# Patient Record
Sex: Male | Born: 1965 | Race: Black or African American | Hispanic: No | Marital: Married | Smoking: Never smoker
Health system: Southern US, Community
[De-identification: ages and names within clinical notes are randomized; demographics above are authoritative.]

## PROBLEM LIST (undated history)

## (undated) DIAGNOSIS — I10 Essential (primary) hypertension: Secondary | ICD-10-CM

## (undated) HISTORY — DX: Essential (primary) hypertension: I10

---
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
## 2006-10-07 ENCOUNTER — Ambulatory Visit (HOSPITAL_COMMUNITY): Admission: RE | Admit: 2006-10-07 | Discharge: 2006-10-07 | Payer: Self-pay | Admitting: Family Medicine

## 2010-03-25 ENCOUNTER — Encounter: Payer: Self-pay | Admitting: Family Medicine

## 2011-09-03 ENCOUNTER — Encounter: Payer: Self-pay | Admitting: Family Medicine

## 2011-09-03 ENCOUNTER — Ambulatory Visit (INDEPENDENT_AMBULATORY_CARE_PROVIDER_SITE_OTHER): Payer: 59 | Admitting: Family Medicine

## 2011-09-03 VITALS — BP 150/93 | HR 78 | Temp 97.8°F | Ht 69.0 in | Wt 190.0 lb

## 2011-09-03 DIAGNOSIS — M5412 Radiculopathy, cervical region: Secondary | ICD-10-CM

## 2011-09-03 MED ORDER — PREDNISONE 10 MG PO TABS: ORAL_TABLET | ORAL | Status: DC

## 2011-09-03 NOTE — Patient Instructions (Addendum)
You have cervical radiculopathy (an irritated nerve in the neck). Prednisone 6 day dose pack followed by naproxen twice a day with food as needed to relieve irritation/inflammation of the nerve. Consider cervical collar if severely painful. Simple range of motion exercises within limits of pain to prevent further stiffness. Speak to your brother about a home exercise program for cervical radiculopathy.  If you want to do formal PT, let me know. Heat 15 minutes at a time 3-4 times a day to help with spasms. Watch head position when on computers, texting, when sleeping in bed - should in line with back to prevent further nerve traction and irritation. Follow up with me in 6 weeks or as needed. If not improving we will consider further imaging.

## 2011-09-04 ENCOUNTER — Encounter: Payer: Self-pay | Admitting: Family Medicine

## 2011-09-04 DIAGNOSIS — M5412 Radiculopathy, cervical region: Secondary | ICD-10-CM | POA: Insufficient documentation

## 2011-09-04 NOTE — Progress Notes (Signed)
  Subjective:    Patient ID: Craig Thompson, male    DOB: [birth year], 46 y.o.   MRN: [id number]  PCP: Dr. Mila Palmer  HPI 46 yo M here for left shoulder, arm pain.  Patient reports for several years has had off and on left shoulder, arm pain. Then past month has become much more constant. Start superior lateral shoulder, radiates up to neck and down to base of thumb. Associated with tingling in this distribution. No weakness. + night pain. Tried naproxen without much benefit. Not worse or better with any arm/shoulder motions. Right handed.  Past Medical History  Diagnosis Date  . Hypertension     Current Outpatient Prescriptions on File Prior to Visit  Medication Sig Dispense Refill  . amLODipine (NORVASC) 10 MG tablet Take 10 mg by mouth daily.      Marland Kitchen lisinopril (PRINIVIL,ZESTRIL) 10 MG tablet Take 10 mg by mouth daily.        History reviewed. No pertinent past surgical history.  No Known Allergies  History   Social History  . Marital Status: Married    Spouse Name: N/A    Number of Children: N/A  . Years of Education: N/A   Occupational History  . Not on file.   Social History Main Topics  . Smoking status: Never Smoker   . Smokeless tobacco: Not on file  . Alcohol Use: Not on file  . Drug Use: Not on file  . Sexually Active: Not on file   Other Topics Concern  . Not on file   Social History Narrative  . No narrative on file    Family History  Problem Relation Age of Onset  . Diabetes Mother   . Hypertension Father   . Heart attack Neg Hx   . Hyperlipidemia Neg Hx   . Sudden death Neg Hx     BP 150/93  Pulse 78  Temp 97.8 F (36.6 C) (Oral)  Ht 5\' 9"  (1.753 m)  Wt 190 lb (86.183 kg)  BMI 28.06 kg/m2  Review of Systems See HPI above.    Objective:   Physical Exam Gen: NAD  Neck: No gross deformity, swelling, bruising. TTP Left trapezius, cervical paraspinal muscles.  No midline/bony TTP. FROM neck - pain on left rotation and  extension in left side neck and arm. BUE strength 5/5.   Sensation intact to light touch.   1+ equal reflexes in triceps, biceps, brachioradialis tendons. Negative spurlings. NV intact distal BUEs.  L shoulder: No swelling, ecchymoses.  No gross deformity. No TTP. FROM. Negative Hawkins, Neers. Negative Speeds, Yergasons. Strength 5/5 with empty can and resisted internal/external rotation. Negative apprehension. NV intact distally.    Assessment & Plan:  1. Cervical radiculopathy - Start prednisone dose pack and then return to taking naproxen.  Brother is a physical therapist - will speak to him about HEP - declined formal PT.  Discussed ergonomic issues.  Heat as needed.  Consider massage.  If not improving as expected will move forward with further imaging.  F/u in 6 weeks or prn.

## 2011-09-06 NOTE — Assessment & Plan Note (Signed)
Start prednisone dose pack and then return to taking naproxen.  Brother is a physical therapist - will speak to him about HEP - declined formal PT.  Discussed ergonomic issues.  Heat as needed.  Consider massage.  If not improving as expected will move forward with further imaging.  F/u in 6 weeks or prn.

## 2013-02-21 ENCOUNTER — Other Ambulatory Visit: Payer: Self-pay | Admitting: Family Medicine

## 2015-05-31 ENCOUNTER — Encounter: Payer: Self-pay | Admitting: Sports Medicine

## 2015-05-31 ENCOUNTER — Ambulatory Visit (INDEPENDENT_AMBULATORY_CARE_PROVIDER_SITE_OTHER): Payer: 59

## 2015-05-31 ENCOUNTER — Ambulatory Visit (INDEPENDENT_AMBULATORY_CARE_PROVIDER_SITE_OTHER): Payer: 59 | Admitting: Sports Medicine

## 2015-05-31 DIAGNOSIS — M5412 Radiculopathy, cervical region: Secondary | ICD-10-CM | POA: Diagnosis not present

## 2015-05-31 DIAGNOSIS — M50321 Other cervical disc degeneration at C4-C5 level: Secondary | ICD-10-CM

## 2015-05-31 MED ORDER — PREDNISONE 50 MG PO TABS: ORAL_TABLET | ORAL | Status: DC

## 2015-05-31 MED ORDER — CYCLOBENZAPRINE HCL 10 MG PO TABS: ORAL_TABLET | ORAL | Status: DC

## 2015-05-31 MED ORDER — MELOXICAM 15 MG PO TABS: ORAL_TABLET | ORAL | Status: DC

## 2015-05-31 NOTE — Assessment & Plan Note (Signed)
Classic C6 distribution radiculitis with clear spasm posturing. Prednisone, x-rays, Flexeril, meloxicam, brother is a physical therapist. Return to see me in one month, MRI for interventional planning if no better.

## 2015-05-31 NOTE — Progress Notes (Signed)
   Subjective:    I'm seeing this patient as a consultation for:  Dr. 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 Symptoms are moderate, persistent, radiation down to the right thumb, worse with looking down, no constitutional symptoms, no trauma. No lower extremity weakness.  Past medical history, Surgical history, Family history not pertinant except as noted below, Social history, Allergies, and medications have been entered into the medical record, reviewed, and no changes needed.   Review of Systems: No headache, visual changes, nausea, vomiting, diarrhea, constipation, dizziness, abdominal pain, skin rash, fevers, chills, night sweats, weight loss, swollen lymph nodes, body aches, joint swelling, muscle aches, chest pain, shortness of breath, mood changes, visual or auditory hallucinations.   Objective:   General: Well Developed, well nourished, and in no acute distress.  Neuro/Psych: Alert and oriented x3, extra-ocular muscles intact, able to move all 4 extremities, sensation grossly intact. Skin: Warm and dry, no rashes noted.  Respiratory: Not using accessory muscles, speaking in full sentences, trachea midline.  Cardiovascular: Pulses palpable, no extremity edema. Abdomen: Does not appear distended. Neck: Negative spurling's Loss of cervical lordosis suggestive of spasm Grip strength and sensation normal in bilateral hands Strength good C4 to T1 distribution No sensory change to C4 to T1 Reflexes normal  X-rays reviewed and shows C4-C5 degenerative disc disease  Impression and Recommendations:   This case required medical decision making of moderate complexity.

## 2015-06-19 ENCOUNTER — Other Ambulatory Visit: Payer: Self-pay | Admitting: Sports Medicine

## 2015-06-29 ENCOUNTER — Ambulatory Visit (INDEPENDENT_AMBULATORY_CARE_PROVIDER_SITE_OTHER): Payer: 59 | Admitting: Sports Medicine

## 2015-06-29 ENCOUNTER — Encounter: Payer: Self-pay | Admitting: Sports Medicine

## 2015-06-29 DIAGNOSIS — M5412 Radiculopathy, cervical region: Secondary | ICD-10-CM | POA: Diagnosis not present

## 2015-06-29 NOTE — Assessment & Plan Note (Addendum)
Persistent right C6 radicular symptoms with a positive Spurling sign. We are proceeding to MRI for interventional planning, he will see me after the MRI.

## 2015-06-29 NOTE — Progress Notes (Signed)
  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 Agrees to proceed with interventional treatment.  Past medical history, Surgical history, Family history not pertinant except as noted below, Social history, Allergies, and medications have been entered into the medical record, reviewed, and no changes needed.   Review of Systems: No fevers, chills, night sweats, weight loss, chest pain, or shortness of breath.   Objective:    General: Well Developed, well nourished, and in no acute distress.  Neuro: Alert and oriented x3, extra-ocular muscles intact, sensation grossly intact.  HEENT: Normocephalic, atraumatic, pupils equal round reactive to light, neck supple, no masses, no lymphadenopathy, thyroid nonpalpable.  Skin: Warm and dry, no rashes. Cardiac: Regular rate and rhythm, no murmurs rubs or gallops, no lower extremity edema.  Respiratory: Clear to auscultation bilaterally. Not using accessory muscles, speaking in full sentences.  Impression and Recommendations:    I spent 25 minutes with this patient, greater than 50% was face-to-face time counseling regarding the above diagnoses

## 2015-07-03 ENCOUNTER — Encounter: Payer: Self-pay | Admitting: Sports Medicine

## 2015-07-03 ENCOUNTER — Ambulatory Visit (INDEPENDENT_AMBULATORY_CARE_PROVIDER_SITE_OTHER): Payer: 59

## 2015-07-03 ENCOUNTER — Ambulatory Visit (INDEPENDENT_AMBULATORY_CARE_PROVIDER_SITE_OTHER): Payer: 59 | Admitting: Sports Medicine

## 2015-07-03 DIAGNOSIS — M4602 Spinal enthesopathy, cervical region: Secondary | ICD-10-CM | POA: Diagnosis not present

## 2015-07-03 DIAGNOSIS — M5412 Radiculopathy, cervical region: Secondary | ICD-10-CM | POA: Diagnosis not present

## 2015-07-03 DIAGNOSIS — M4802 Spinal stenosis, cervical region: Secondary | ICD-10-CM

## 2015-07-03 NOTE — Progress Notes (Signed)
  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 He is agreeable to proceed with interventional treatment. No bowel or bladder dysfunction, saddle numbness, no constitutional symptoms. No trauma. Pain is moderate, persistent.  Past medical history, Surgical history, Family history not pertinant except as noted below, Social history, Allergies, and medications have been entered into the medical record, reviewed, and no changes needed.   Review of Systems: No fevers, chills, night sweats, weight loss, chest pain, or shortness of breath.   Objective:    General: Well Developed, well nourished, and in no acute distress.  Neuro: Alert and oriented x3, extra-ocular muscles intact, sensation grossly intact.  HEENT: Normocephalic, atraumatic, pupils equal round reactive to light, neck supple, no masses, no lymphadenopathy, thyroid nonpalpable.  Skin: Warm and dry, no rashes. Cardiac: Regular rate and rhythm, no murmurs rubs or gallops, no lower extremity edema.  Respiratory: Clear to auscultation bilaterally. Not using accessory muscles, speaking in full sentences.  Impression and Recommendations:   I spent 25 minutes with this patient, greater than 50% was face-to-face time counseling regarding the above diagnoses

## 2015-07-03 NOTE — Assessment & Plan Note (Signed)
MRI does confirm moderate to severe central canal stenosis at the C4-C5 level. We are going to proceed with an epidural however I do suspect this may need operative intervention. For this reason we are also get a second opinion from Dr. Yevette Edwardsumonski with [facility]. Return to see me one month after the epidural injection.

## 2015-07-10 ENCOUNTER — Other Ambulatory Visit: Payer: 59

## 2015-07-13 ENCOUNTER — Ambulatory Visit
Admission: RE | Admit: 2015-07-13 | Discharge: 2015-07-13 | Disposition: A | Discharge status: Home / Self Care | Payer: 59 | Source: Ambulatory Visit | Attending: Sports Medicine | Admitting: Sports Medicine

## 2015-07-13 MED ORDER — TRIAMCINOLONE ACETONIDE 40 MG/ML IJ SUSP (RADIOLOGY)
60.0000 mg | Freq: Once | INTRAMUSCULAR | Status: AC
  Administered 2015-07-13: 60 mg via EPIDURAL

## 2015-07-13 MED ORDER — IOHEXOL 300 MG/ML  SOLN
1.0000 mL | Freq: Once | INTRAMUSCULAR | Status: AC | PRN
  Administered 2015-07-13: 1 mL via EPIDURAL

## 2015-07-13 NOTE — Discharge Instructions (Signed)

## 2015-08-01 ENCOUNTER — Encounter: Payer: Self-pay | Admitting: Sports Medicine

## 2015-08-01 ENCOUNTER — Ambulatory Visit (INDEPENDENT_AMBULATORY_CARE_PROVIDER_SITE_OTHER): Payer: 59 | Admitting: Sports Medicine

## 2015-08-01 VITALS — BP 133/83 | HR 88 | Resp 18 | Wt 221.7 lb

## 2015-08-01 DIAGNOSIS — M5412 Radiculopathy, cervical region: Secondary | ICD-10-CM | POA: Diagnosis not present

## 2015-08-01 NOTE — Progress Notes (Signed)
  Subjective:    CC: Follow-up  HPI: This is a pleasant 50 year old male, he has C5-C6 degenerative disc disease, physical therapy fails we proceeded with epidural, he returns today with 100% pain relief. Happy with how things are going and no further questions. Has not yet seen Dr. Yevette Edwardsumonski.  Past medical history, Surgical history, Family history not pertinant except as noted below, Social history, Allergies, and medications have been entered into the medical record, reviewed, and no changes needed.   Review of Systems: No fevers, chills, night sweats, weight loss, chest pain, or shortness of breath.   Objective:    General: Well Developed, well nourished, and in no acute distress.  Neuro: Alert and oriented x3, extra-ocular muscles intact, sensation grossly intact.  HEENT: Normocephalic, atraumatic, pupils equal round reactive to light, neck supple, no masses, no lymphadenopathy, thyroid nonpalpable.  Skin: Warm and dry, no rashes. Cardiac: Regular rate and rhythm, no murmurs rubs or gallops, no lower extremity edema.  Respiratory: Clear to auscultation bilaterally. Not using accessory muscles, speaking in full sentences. Neck: Negative spurling's Full neck range of motion Grip strength and sensation normal in bilateral hands Strength good C4 to T1 distribution No sensory change to C4 to T1 Reflexes normal Impression and Recommendations:

## 2015-08-01 NOTE — Assessment & Plan Note (Signed)
Asymptomatic now, epidural was tremendously effective. Advised to avoid dead lifts, power cleans, overhead press for now, but may work out in [employer] gym as he desires.

## 2016-05-22 ENCOUNTER — Other Ambulatory Visit: Payer: Self-pay | Admitting: Sports Medicine

## 2016-05-22 DIAGNOSIS — M5412 Radiculopathy, cervical region: Secondary | ICD-10-CM

## 2016-10-09 MED FILL — AMLODIPINE BESYLATE 5 MG TA: 5 | 90 days supply | Qty: 90 | Fill #0

## 2016-10-09 MED FILL — OLMESARTAN MEDOXOMIL 20 MG: 20 | 90 days supply | Qty: 90 | Fill #0

## 2016-10-29 ENCOUNTER — Telehealth: Payer: Self-pay | Admitting: Sports Medicine

## 2016-10-29 NOTE — Telephone Encounter (Signed)
Because this is preventative care his PCP should put this referral in for him. Dr. Karie Schwalbe has only seen him for sports medicine concerns. Please inform pt.

## 2016-10-29 NOTE — Telephone Encounter (Signed)
Pt sent mychart message stating he is due or his colonoscopy and would like if we can put that referral in for him. Thanks

## 2016-10-29 NOTE — Telephone Encounter (Signed)
I called Craig Thompson back and explained to him [facility] MyChart is not connected to his PCP with [facility]. He will call their office to have them send a referral.

## 2016-11-20 DIAGNOSIS — Z1211 Encounter for screening for malignant neoplasm of colon: Secondary | ICD-10-CM | POA: Diagnosis not present

## 2017-01-13 MED FILL — AMLODIPINE BESYLATE 5 MG TA: 5 | 90 days supply | Qty: 90 | Fill #1

## 2017-01-13 MED FILL — OLMESARTAN MEDOXOMIL 20 MG: 20 | 90 days supply | Qty: 90 | Fill #1

## 2017-02-05 MED FILL — PEG-3350 SOLUTION: 420 | 1 days supply | Qty: 4000 | Fill #0

## 2017-04-14 MED FILL — AMLODIPINE BESYLATE 5 MG TA: 5 | 90 days supply | Qty: 90 | Fill #0

## 2017-04-14 MED FILL — LORazepam 0.5 MG TABS: 0.5 | 30 days supply | Qty: 30 | Fill #0

## 2017-04-14 MED FILL — OLMESARTAN MEDOXOMIL 20 MG: 20 | 90 days supply | Qty: 90 | Fill #0

## 2017-04-18 MED FILL — FLUTICASONE PROP 50 MCG SPR: 50 | 30 days supply | Qty: 16 | Fill #0

## 2017-08-12 MED FILL — AMLODIPINE BESYLATE 5 MG TA: 5 | 90 days supply | Qty: 90 | Fill #1

## 2017-08-12 MED FILL — OLMESARTAN MEDOXOMIL 20 MG: 20 | 90 days supply | Qty: 90 | Fill #1

## 2017-11-06 MED FILL — AMLODIPINE BESYLATE 5 MG TA: 5 | 90 days supply | Qty: 90 | Fill #2

## 2017-11-06 MED FILL — OLMESARTAN MEDOXOMIL 20 MG: 20 | 90 days supply | Qty: 90 | Fill #2

## 2018-01-12 IMAGING — CR DG CERVICAL SPINE COMPLETE 4+V
6 series · 6 of 6 positions shown · non-contrast
Comparison: None.

CLINICAL DATA: Neck pain for 1 week with right-sided radiculopathy.
Numbness in the right thumb. No known injury. Initial encounter.

EXAM:
CERVICAL SPINE - COMPLETE 4+ VIEW

[c-spine lat]
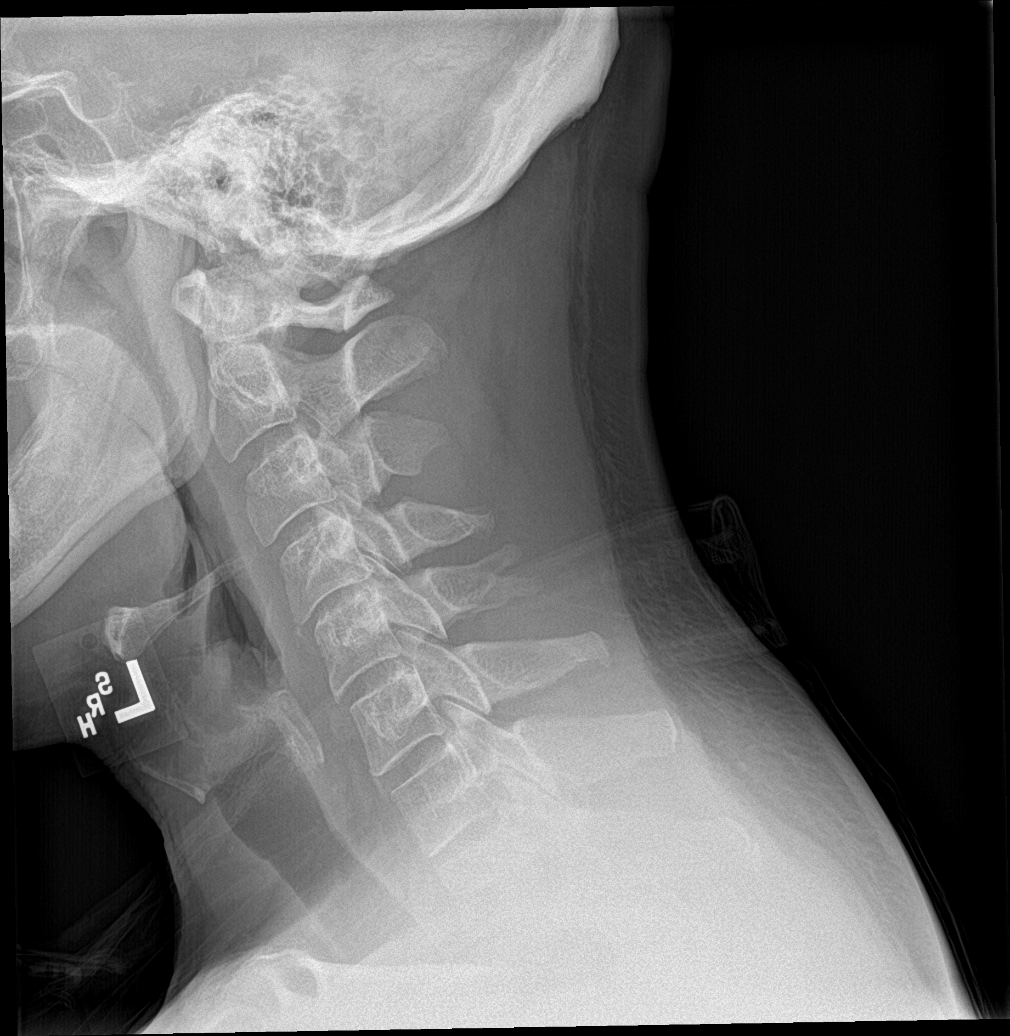

[c-spine obl (1 of 2)]
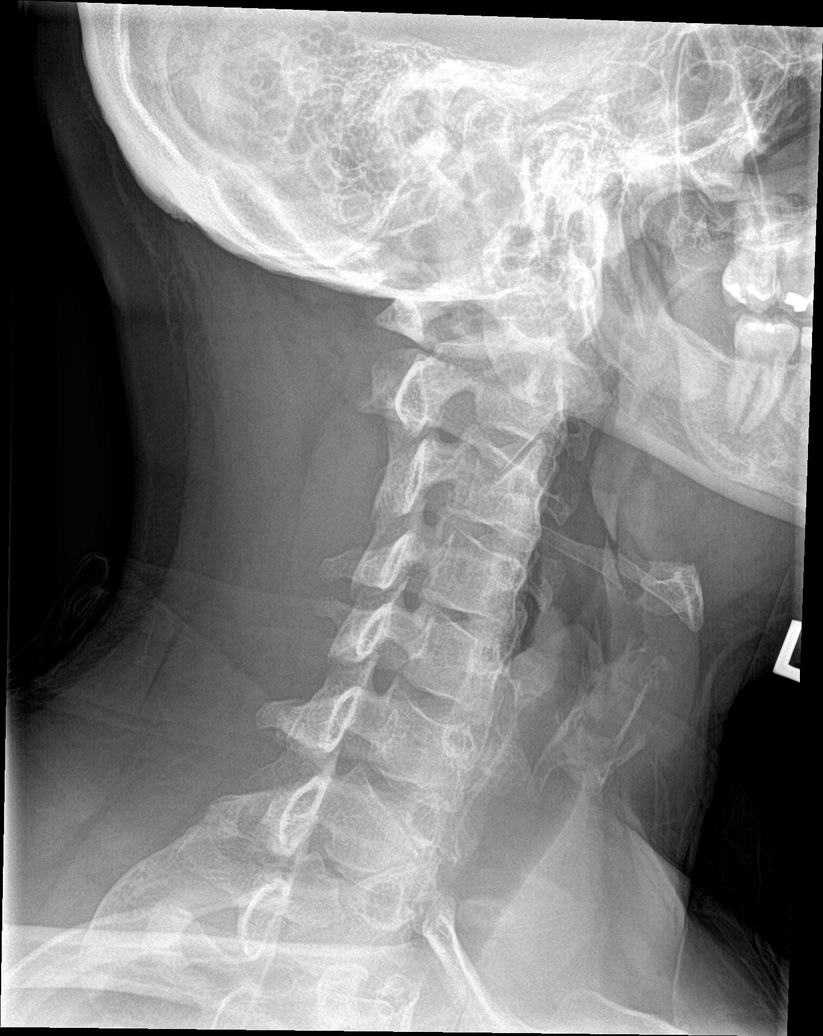

[c-spine obl (2 of 2)]
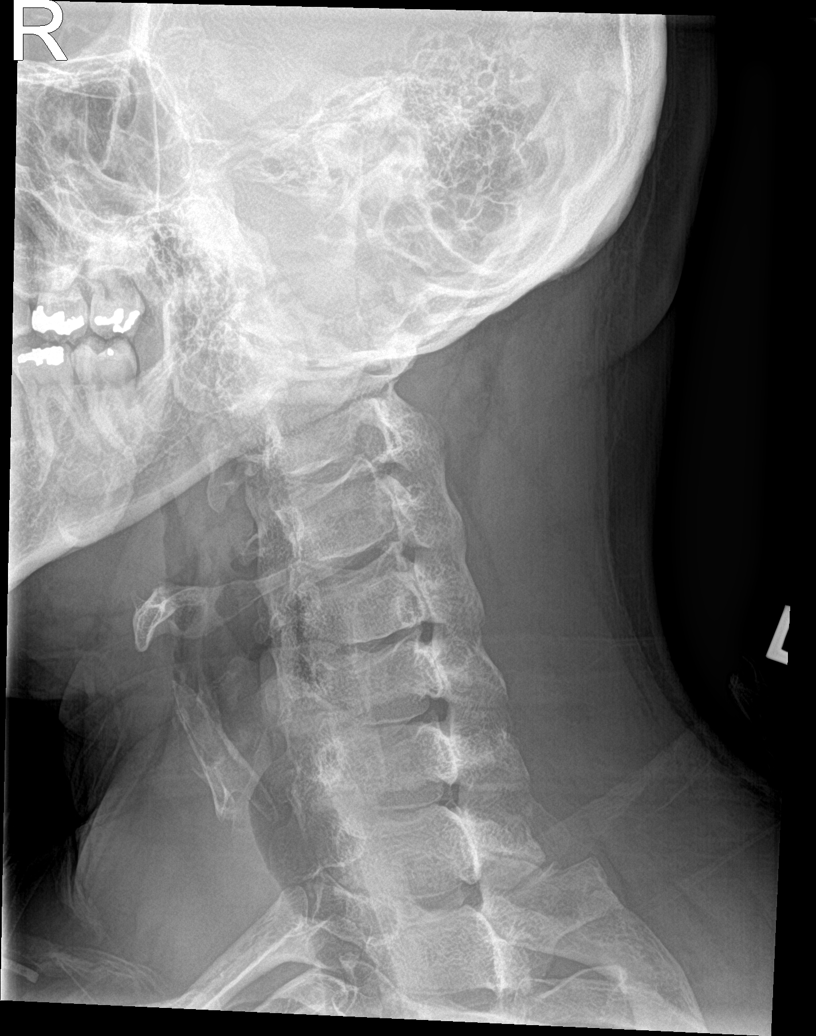

[c-spine ap]
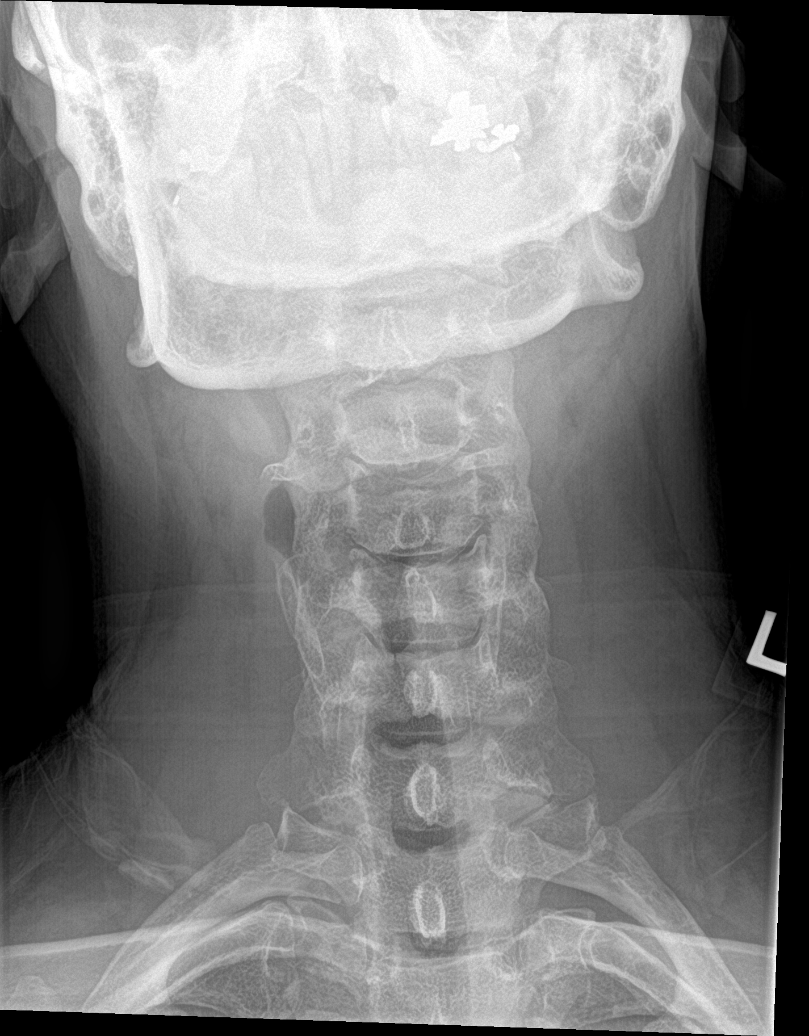

[c-spine open mouth]
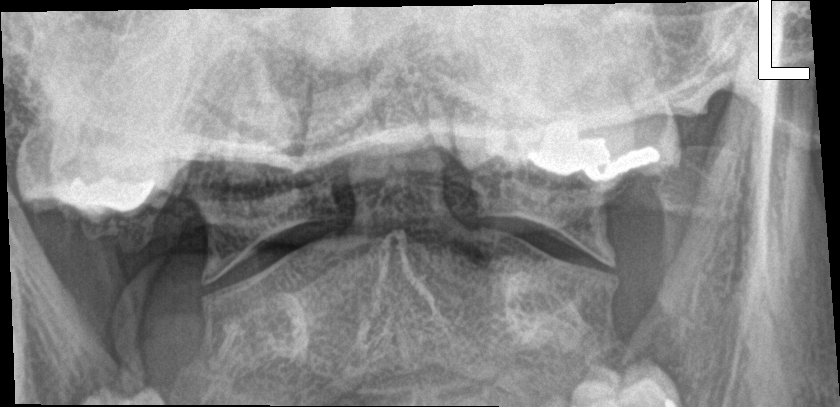

[[person_name]]
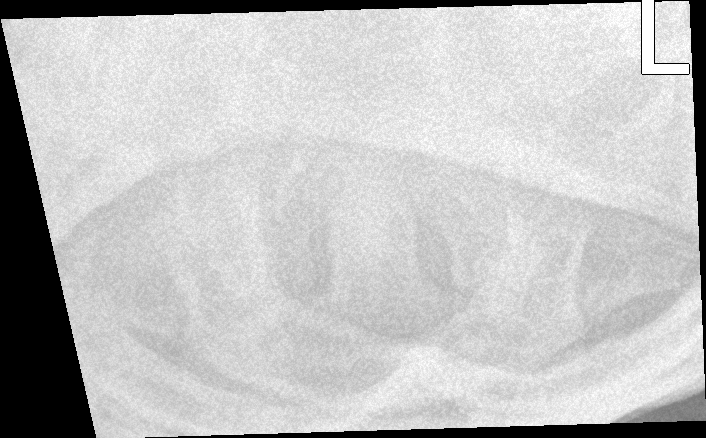

[6 of 6 positions shown; findings below may reference images not displayed]

FINDINGS: Vertebral body height and alignment are maintained. Mild loss of
disc space height is seen at C4-5. The facet joints are
unremarkable. Prevertebral soft tissues appear normal.
IMPRESSION: Mild appearing degenerative disc disease C4-5. The examination is
otherwise negative.

## 2018-03-03 MED FILL — AMLODIPINE BESYLATE 5 MG TA: 5 | 90 days supply | Qty: 90 | Fill #3

## 2018-03-03 MED FILL — OLMESARTAN MEDOXOMIL 20 MG: 20 | 90 days supply | Qty: 90 | Fill #3

## 2018-05-21 MED FILL — OLMESARTAN MEDOXOMIL 20 MG: 20 | 90 days supply | Qty: 90 | Fill #0

## 2018-05-21 MED FILL — AMLODIPINE BESYLATE 5 MG TA: 5 | 90 days supply | Qty: 90 | Fill #0

## 2018-08-21 MED FILL — AMLODIPINE BESYLATE 5 MG TA: 5 | 90 days supply | Qty: 90 | Fill #0

## 2018-08-21 MED FILL — OLMESARTAN MEDOXOMIL 20 MG: 20 | 90 days supply | Qty: 90 | Fill #0

## 2018-11-16 MED FILL — OLMESARTAN MEDOXOMIL 20 MG: 20 | 90 days supply | Qty: 90 | Fill #1

## 2018-11-16 MED FILL — AMLODIPINE BESYLATE 5 MG TA: 5 | 90 days supply | Qty: 90 | Fill #1

## 2019-02-05 MED FILL — AMLODIPINE BESYLATE 5 MG TA: 5 | 90 days supply | Qty: 90 | Fill #2

## 2019-02-05 MED FILL — OLMESARTAN MEDOXOMIL 20 MG: 20 | 90 days supply | Qty: 90 | Fill #2

## 2019-03-03 ENCOUNTER — Other Ambulatory Visit: Payer: Self-pay

## 2019-03-03 DIAGNOSIS — Z20822 Contact with and (suspected) exposure to covid-19: Secondary | ICD-10-CM

## 2019-03-05 LAB — NOVEL CORONAVIRUS, NAA: SARS-CoV-2, NAA: NOT DETECTED

## 2019-04-24 ENCOUNTER — Ambulatory Visit: Payer: No Typology Code available for payment source | Attending: Internal Medicine

## 2019-04-24 DIAGNOSIS — Z23 Encounter for immunization: Secondary | ICD-10-CM | POA: Insufficient documentation

## 2019-04-24 NOTE — Progress Notes (Signed)
   Covid-19 Vaccination Clinic  Name:  Craig Thompson    MRN: [id number] DOB: 12/05/1965  04/24/2019  Mr. Craig Thompson was observed post Covid-19 immunization for 15 minutes without incidence. He was provided with Vaccine Information Sheet and instruction to access the V-Safe system.   Mr. Craig Thompson was instructed to call 911 with any severe reactions post vaccine: Marland Kitchen Difficulty breathing  . Swelling of your face and throat  . A fast heartbeat  . A bad rash all over your body  . Dizziness and weakness    Immunizations Administered    Name Date Dose VIS Date Route   Pfizer COVID-19 Vaccine 04/24/2019  8:18 AM 0.3 mL 02/12/2019 Intramuscular   Manufacturer: [employer], [employer]   Lot: [id number]   NDC: [address]-0

## 2019-04-30 MED FILL — AMLODIPINE BESYLATE 5 MG TA: 5 | 90 days supply | Qty: 90 | Fill #3

## 2019-04-30 MED FILL — OLMESARTAN MEDOXOMIL 20 MG: 20 | 90 days supply | Qty: 90 | Fill #3

## 2019-05-06 MED FILL — MELOXICAM 15 MG TABLET: 15 | 30 days supply | Qty: 30 | Fill #0

## 2019-05-06 MED FILL — HYDROCODON-APAP 5-325: 5-325 | 5 days supply | Qty: 20 | Fill #0

## 2019-05-07 ENCOUNTER — Other Ambulatory Visit: Payer: Self-pay

## 2019-05-07 ENCOUNTER — Other Ambulatory Visit: Payer: Self-pay | Admitting: Family Medicine

## 2019-05-07 ENCOUNTER — Ambulatory Visit
Admission: RE | Admit: 2019-05-07 | Discharge: 2019-05-07 | Disposition: A | Discharge status: Home / Self Care | Payer: No Typology Code available for payment source | Source: Ambulatory Visit | Attending: Family Medicine | Admitting: Family Medicine

## 2019-05-07 DIAGNOSIS — G8929 Other chronic pain: Secondary | ICD-10-CM

## 2019-05-18 ENCOUNTER — Ambulatory Visit: Payer: No Typology Code available for payment source | Attending: Internal Medicine

## 2019-05-18 DIAGNOSIS — Z23 Encounter for immunization: Secondary | ICD-10-CM

## 2019-05-18 NOTE — Progress Notes (Signed)
   Covid-19 Vaccination Clinic  Name:  Craig Thompson    MRN: [id number] DOB: [birth year]  05/18/2019  Mr. Craig Thompson was observed post Covid-19 immunization for 15 minutes without incident. He was provided with Vaccine Information Sheet and instruction to access the V-Safe system.   Mr. Craig Thompson was instructed to call 911 with any severe reactions post vaccine: Marland Kitchen Difficulty breathing  . Swelling of face and throat  . A fast heartbeat  . A bad rash all over body  . Dizziness and weakness   Immunizations Administered    Name Date Dose VIS Date Route   Pfizer COVID-19 Vaccine 05/18/2019  9:07 AM 0.3 mL 02/12/2019 Intramuscular   Manufacturer: [employer], [employer]   Lot: [id number]   NDC: [address]-4

## 2019-05-31 MED FILL — MELOXICAM 15 MG TABLET: 15 | 30 days supply | Qty: 30 | Fill #1

## 2019-06-04 MED FILL — CYCLOBENZAPRINE HCL 10 MG T: 10 | 10 days supply | Qty: 30 | Fill #0

## 2019-07-20 ENCOUNTER — Other Ambulatory Visit (HOSPITAL_BASED_OUTPATIENT_CLINIC_OR_DEPARTMENT_OTHER): Payer: Self-pay | Admitting: Family Medicine

## 2019-07-23 MED FILL — OLMESARTAN MEDOXOMIL 20 MG: 20 | 90 days supply | Qty: 90 | Fill #0

## 2019-07-23 MED FILL — AMLODIPINE BESYLATE 5 MG TA: 5 | 90 days supply | Qty: 90 | Fill #0

## 2019-11-17 MED FILL — AMLODIPINE BESYLATE 5 MG TA: 5 | 90 days supply | Qty: 90 | Fill #1

## 2019-11-17 MED FILL — OLMESARTAN MEDOXOMIL 20 MG: 20 | 90 days supply | Qty: 90 | Fill #1

## 2020-01-14 ENCOUNTER — Other Ambulatory Visit (HOSPITAL_BASED_OUTPATIENT_CLINIC_OR_DEPARTMENT_OTHER): Payer: Self-pay | Admitting: Internal Medicine

## 2020-01-14 ENCOUNTER — Ambulatory Visit: Payer: No Typology Code available for payment source | Attending: Internal Medicine

## 2020-01-14 DIAGNOSIS — Z23 Encounter for immunization: Secondary | ICD-10-CM

## 2020-01-14 NOTE — Progress Notes (Signed)
° °  Covid-19 Vaccination Clinic  Name:  Craig Thompson    MRN: [id number] DOB: 1966-02-15  01/14/2020  Mr. Craig Thompson was observed post Covid-19 immunization for 15 minutes without incident. He was provided with Vaccine Information Sheet and instruction to access the V-Safe system.   Mr. Craig Thompson was instructed to call 911 with any severe reactions post vaccine:  Difficulty breathing   Swelling of face and throat   A fast heartbeat   A bad rash all over body   Dizziness and weakness

## 2020-01-17 MED FILL — MODERNA COVID-19 VACCINE 10: 100 | 1 days supply | Qty: 0 | Fill #0

## 2020-02-08 MED FILL — AMLODIPINE BESYLATE 5 MG TA: 5 | 90 days supply | Qty: 90 | Fill #2

## 2020-02-08 MED FILL — OLMESARTAN MEDOXOMIL 20 MG: 20 | 90 days supply | Qty: 90 | Fill #2

## 2020-07-25 DIAGNOSIS — I10 Essential (primary) hypertension: Secondary | ICD-10-CM | POA: Diagnosis not present

## 2020-07-25 DIAGNOSIS — Z79899 Other long term (current) drug therapy: Secondary | ICD-10-CM | POA: Diagnosis not present

## 2020-07-25 DIAGNOSIS — Z0000 Encounter for general adult medical examination without abnormal findings: Secondary | ICD-10-CM | POA: Diagnosis not present

## 2020-07-25 DIAGNOSIS — R7301 Impaired fasting glucose: Secondary | ICD-10-CM | POA: Diagnosis not present

## 2020-08-08 ENCOUNTER — Other Ambulatory Visit (HOSPITAL_BASED_OUTPATIENT_CLINIC_OR_DEPARTMENT_OTHER): Payer: Self-pay

## 2020-08-21 DIAGNOSIS — E611 Iron deficiency: Secondary | ICD-10-CM | POA: Diagnosis not present

## 2020-08-21 DIAGNOSIS — Z125 Encounter for screening for malignant neoplasm of prostate: Secondary | ICD-10-CM | POA: Diagnosis not present

## 2020-09-05 ENCOUNTER — Other Ambulatory Visit (HOSPITAL_BASED_OUTPATIENT_CLINIC_OR_DEPARTMENT_OTHER): Payer: Self-pay

## 2020-09-05 MED ORDER — AMLODIPINE BESYLATE 5 MG PO TABS
5.0000 mg | ORAL_TABLET | Freq: Every day | ORAL | 3 refills | Status: DC
  Filled 2020-09-05: qty 90, 90d supply, fill #0
  Filled 2020-12-22: qty 90, 90d supply, fill #1
  Filled 2021-04-03: qty 90, 90d supply, fill #2
  Filled 2021-06-27: qty 90, 90d supply, fill #3

## 2020-09-05 MED ORDER — OLMESARTAN MEDOXOMIL 20 MG PO TABS
20.0000 mg | ORAL_TABLET | Freq: Every day | ORAL | 3 refills | Status: DC
  Filled 2020-09-05: qty 90, 90d supply, fill #0
  Filled 2020-12-22: qty 90, 90d supply, fill #1
  Filled 2021-04-03: qty 90, 90d supply, fill #2
  Filled 2021-06-27: qty 90, 90d supply, fill #3

## 2020-09-14 ENCOUNTER — Other Ambulatory Visit (HOSPITAL_BASED_OUTPATIENT_CLINIC_OR_DEPARTMENT_OTHER): Payer: Self-pay

## 2020-09-14 DIAGNOSIS — K625 Hemorrhage of anus and rectum: Secondary | ICD-10-CM | POA: Diagnosis not present

## 2020-09-14 DIAGNOSIS — I10 Essential (primary) hypertension: Secondary | ICD-10-CM | POA: Diagnosis not present

## 2020-09-14 DIAGNOSIS — K649 Unspecified hemorrhoids: Secondary | ICD-10-CM | POA: Diagnosis not present

## 2020-09-14 DIAGNOSIS — K219 Gastro-esophageal reflux disease without esophagitis: Secondary | ICD-10-CM | POA: Diagnosis not present

## 2020-09-14 DIAGNOSIS — D509 Iron deficiency anemia, unspecified: Secondary | ICD-10-CM | POA: Diagnosis not present

## 2020-09-14 MED ORDER — HYDROCORTISONE ACETATE 25 MG RE SUPP
RECTAL | 2 refills | Status: AC
  Filled 2020-09-14: qty 28, 14d supply, fill #0
  Filled 2021-08-15: qty 28, 14d supply, fill #1

## 2020-09-15 ENCOUNTER — Other Ambulatory Visit (HOSPITAL_BASED_OUTPATIENT_CLINIC_OR_DEPARTMENT_OTHER): Payer: Self-pay

## 2020-10-09 DIAGNOSIS — Z20822 Contact with and (suspected) exposure to covid-19: Secondary | ICD-10-CM | POA: Diagnosis not present

## 2020-12-22 ENCOUNTER — Other Ambulatory Visit (HOSPITAL_BASED_OUTPATIENT_CLINIC_OR_DEPARTMENT_OTHER): Payer: Self-pay

## 2021-04-03 ENCOUNTER — Other Ambulatory Visit (HOSPITAL_BASED_OUTPATIENT_CLINIC_OR_DEPARTMENT_OTHER): Payer: Self-pay

## 2021-06-27 ENCOUNTER — Other Ambulatory Visit (HOSPITAL_BASED_OUTPATIENT_CLINIC_OR_DEPARTMENT_OTHER): Payer: Self-pay

## 2021-06-28 ENCOUNTER — Other Ambulatory Visit (HOSPITAL_BASED_OUTPATIENT_CLINIC_OR_DEPARTMENT_OTHER): Payer: Self-pay

## 2021-06-28 MED ORDER — COVID-19 AT HOME ANTIGEN TEST VI KIT
PACK | 0 refills | Status: AC
  Filled 2021-06-28: qty 8, 8d supply, fill #0

## 2021-07-05 ENCOUNTER — Other Ambulatory Visit (HOSPITAL_BASED_OUTPATIENT_CLINIC_OR_DEPARTMENT_OTHER): Payer: Self-pay

## 2021-07-05 DIAGNOSIS — Z125 Encounter for screening for malignant neoplasm of prostate: Secondary | ICD-10-CM | POA: Diagnosis not present

## 2021-07-05 DIAGNOSIS — D509 Iron deficiency anemia, unspecified: Secondary | ICD-10-CM | POA: Diagnosis not present

## 2021-07-05 DIAGNOSIS — N529 Male erectile dysfunction, unspecified: Secondary | ICD-10-CM | POA: Diagnosis not present

## 2021-07-05 DIAGNOSIS — Z6830 Body mass index (BMI) 30.0-30.9, adult: Secondary | ICD-10-CM | POA: Diagnosis not present

## 2021-07-05 DIAGNOSIS — Z0000 Encounter for general adult medical examination without abnormal findings: Secondary | ICD-10-CM | POA: Diagnosis not present

## 2021-07-05 DIAGNOSIS — Z79899 Other long term (current) drug therapy: Secondary | ICD-10-CM | POA: Diagnosis not present

## 2021-07-05 DIAGNOSIS — I10 Essential (primary) hypertension: Secondary | ICD-10-CM | POA: Diagnosis not present

## 2021-07-05 DIAGNOSIS — R7301 Impaired fasting glucose: Secondary | ICD-10-CM | POA: Diagnosis not present

## 2021-07-05 MED ORDER — TADALAFIL 5 MG PO TABS
ORAL_TABLET | ORAL | 5 refills | Status: DC
  Filled 2021-07-05: qty 30, 30d supply, fill #0

## 2021-07-05 MED ORDER — WEGOVY 0.25 MG/0.5ML ~~LOC~~ SOAJ
SUBCUTANEOUS | 0 refills | Status: DC
  Filled 2021-07-05: qty 2, 28d supply, fill #0

## 2021-07-06 ENCOUNTER — Other Ambulatory Visit (HOSPITAL_BASED_OUTPATIENT_CLINIC_OR_DEPARTMENT_OTHER): Payer: Self-pay

## 2021-07-09 ENCOUNTER — Other Ambulatory Visit (HOSPITAL_BASED_OUTPATIENT_CLINIC_OR_DEPARTMENT_OTHER): Payer: Self-pay

## 2021-07-10 ENCOUNTER — Other Ambulatory Visit (HOSPITAL_BASED_OUTPATIENT_CLINIC_OR_DEPARTMENT_OTHER): Payer: Self-pay

## 2021-07-12 ENCOUNTER — Other Ambulatory Visit (HOSPITAL_BASED_OUTPATIENT_CLINIC_OR_DEPARTMENT_OTHER): Payer: Self-pay

## 2021-07-20 ENCOUNTER — Other Ambulatory Visit (HOSPITAL_BASED_OUTPATIENT_CLINIC_OR_DEPARTMENT_OTHER): Payer: Self-pay

## 2021-07-23 ENCOUNTER — Other Ambulatory Visit (HOSPITAL_BASED_OUTPATIENT_CLINIC_OR_DEPARTMENT_OTHER): Payer: Self-pay

## 2021-07-24 ENCOUNTER — Other Ambulatory Visit (HOSPITAL_BASED_OUTPATIENT_CLINIC_OR_DEPARTMENT_OTHER): Payer: Self-pay

## 2021-07-24 MED ORDER — WEGOVY 0.5 MG/0.5ML ~~LOC~~ SOAJ
SUBCUTANEOUS | 0 refills | Status: DC
  Filled 2021-07-24: qty 2, 28d supply, fill #0

## 2021-08-02 ENCOUNTER — Other Ambulatory Visit (HOSPITAL_BASED_OUTPATIENT_CLINIC_OR_DEPARTMENT_OTHER): Payer: Self-pay

## 2021-08-15 ENCOUNTER — Other Ambulatory Visit (HOSPITAL_BASED_OUTPATIENT_CLINIC_OR_DEPARTMENT_OTHER): Payer: Self-pay

## 2021-08-16 ENCOUNTER — Other Ambulatory Visit (HOSPITAL_BASED_OUTPATIENT_CLINIC_OR_DEPARTMENT_OTHER): Payer: Self-pay

## 2021-08-16 MED ORDER — OLMESARTAN MEDOXOMIL 20 MG PO TABS
20.0000 mg | ORAL_TABLET | Freq: Every day | ORAL | 3 refills | Status: DC
  Filled 2021-08-16 – 2021-09-13 (×2): qty 90, 90d supply, fill #0
  Filled 2022-01-04: qty 90, 90d supply, fill #1
  Filled 2022-03-30: qty 90, 90d supply, fill #2
  Filled 2022-06-26: qty 90, 90d supply, fill #3

## 2021-08-16 MED ORDER — WEGOVY 1 MG/0.5ML ~~LOC~~ SOAJ
SUBCUTANEOUS | 1 refills | Status: DC
  Filled 2021-08-16: qty 2, 28d supply, fill #0
  Filled 2021-09-13 (×2): qty 2, 28d supply, fill #1

## 2021-08-16 MED ORDER — AMLODIPINE BESYLATE 5 MG PO TABS
5.0000 mg | ORAL_TABLET | Freq: Every day | ORAL | 3 refills | Status: DC
  Filled 2021-08-16 – 2021-09-13 (×2): qty 90, 90d supply, fill #0
  Filled 2022-01-04: qty 90, 90d supply, fill #1
  Filled 2022-03-30: qty 90, 90d supply, fill #2
  Filled 2022-06-26: qty 90, 90d supply, fill #3

## 2021-08-17 ENCOUNTER — Other Ambulatory Visit (HOSPITAL_BASED_OUTPATIENT_CLINIC_OR_DEPARTMENT_OTHER): Payer: Self-pay

## 2021-08-27 ENCOUNTER — Other Ambulatory Visit (HOSPITAL_BASED_OUTPATIENT_CLINIC_OR_DEPARTMENT_OTHER): Payer: Self-pay

## 2021-08-30 ENCOUNTER — Other Ambulatory Visit (HOSPITAL_BASED_OUTPATIENT_CLINIC_OR_DEPARTMENT_OTHER): Payer: Self-pay

## 2021-08-31 ENCOUNTER — Other Ambulatory Visit (HOSPITAL_BASED_OUTPATIENT_CLINIC_OR_DEPARTMENT_OTHER): Payer: Self-pay

## 2021-09-13 ENCOUNTER — Other Ambulatory Visit (HOSPITAL_BASED_OUTPATIENT_CLINIC_OR_DEPARTMENT_OTHER): Payer: Self-pay

## 2021-10-02 ENCOUNTER — Other Ambulatory Visit (HOSPITAL_BASED_OUTPATIENT_CLINIC_OR_DEPARTMENT_OTHER): Payer: Self-pay

## 2021-10-02 MED ORDER — WEGOVY 1 MG/0.5ML ~~LOC~~ SOAJ
SUBCUTANEOUS | 0 refills | Status: DC
  Filled 2021-10-02 – 2021-10-05 (×2): qty 2, 28d supply, fill #0

## 2021-10-04 ENCOUNTER — Other Ambulatory Visit (HOSPITAL_BASED_OUTPATIENT_CLINIC_OR_DEPARTMENT_OTHER): Payer: Self-pay

## 2021-10-05 ENCOUNTER — Other Ambulatory Visit (HOSPITAL_BASED_OUTPATIENT_CLINIC_OR_DEPARTMENT_OTHER): Payer: Self-pay

## 2021-10-24 ENCOUNTER — Other Ambulatory Visit (HOSPITAL_BASED_OUTPATIENT_CLINIC_OR_DEPARTMENT_OTHER): Payer: Self-pay

## 2021-10-24 DIAGNOSIS — R7303 Prediabetes: Secondary | ICD-10-CM | POA: Diagnosis not present

## 2021-10-24 DIAGNOSIS — E663 Overweight: Secondary | ICD-10-CM | POA: Diagnosis not present

## 2021-10-24 MED ORDER — WEGOVY 1 MG/0.5ML ~~LOC~~ SOAJ
SUBCUTANEOUS | 2 refills | Status: DC
  Filled 2021-10-24 – 2021-10-29 (×2): qty 2, 28d supply, fill #0
  Filled 2021-12-05: qty 2, 28d supply, fill #1
  Filled 2022-01-23: qty 2, 28d supply, fill #2
  Filled 2022-02-19 (×2): qty 2, 28d supply, fill #3
  Filled 2022-03-30: qty 2, 28d supply, fill #4
  Filled 2022-05-02: qty 4, 56d supply, fill #4
  Filled 2022-05-03: qty 2, 28d supply, fill #4
  Filled 2022-05-28: qty 2, 28d supply, fill #5
  Filled ????-??-??: fill #3

## 2021-10-25 ENCOUNTER — Other Ambulatory Visit (HOSPITAL_BASED_OUTPATIENT_CLINIC_OR_DEPARTMENT_OTHER): Payer: Self-pay

## 2021-10-29 ENCOUNTER — Other Ambulatory Visit (HOSPITAL_BASED_OUTPATIENT_CLINIC_OR_DEPARTMENT_OTHER): Payer: Self-pay

## 2021-10-31 ENCOUNTER — Other Ambulatory Visit (HOSPITAL_BASED_OUTPATIENT_CLINIC_OR_DEPARTMENT_OTHER): Payer: Self-pay

## 2021-11-01 ENCOUNTER — Other Ambulatory Visit (HOSPITAL_BASED_OUTPATIENT_CLINIC_OR_DEPARTMENT_OTHER): Payer: Self-pay

## 2021-11-02 ENCOUNTER — Other Ambulatory Visit (HOSPITAL_BASED_OUTPATIENT_CLINIC_OR_DEPARTMENT_OTHER): Payer: Self-pay

## 2021-11-06 ENCOUNTER — Other Ambulatory Visit (HOSPITAL_BASED_OUTPATIENT_CLINIC_OR_DEPARTMENT_OTHER): Payer: Self-pay

## 2021-11-07 ENCOUNTER — Other Ambulatory Visit (HOSPITAL_BASED_OUTPATIENT_CLINIC_OR_DEPARTMENT_OTHER): Payer: Self-pay

## 2021-12-05 ENCOUNTER — Other Ambulatory Visit (HOSPITAL_BASED_OUTPATIENT_CLINIC_OR_DEPARTMENT_OTHER): Payer: Self-pay

## 2021-12-06 ENCOUNTER — Other Ambulatory Visit (HOSPITAL_BASED_OUTPATIENT_CLINIC_OR_DEPARTMENT_OTHER): Payer: Self-pay

## 2021-12-25 ENCOUNTER — Other Ambulatory Visit (HOSPITAL_BASED_OUTPATIENT_CLINIC_OR_DEPARTMENT_OTHER): Payer: Self-pay

## 2021-12-25 MED ORDER — WEGOVY 1 MG/0.5ML ~~LOC~~ SOAJ
1.0000 mg | SUBCUTANEOUS | 0 refills | Status: DC
  Filled 2021-12-25 – 2021-12-27 (×2): qty 2, 28d supply, fill #0
  Filled 2022-03-20: qty 2, 28d supply, fill #1
  Filled 2022-04-10 – 2022-04-11 (×2): qty 2, 28d supply, fill #2

## 2021-12-27 ENCOUNTER — Other Ambulatory Visit (HOSPITAL_BASED_OUTPATIENT_CLINIC_OR_DEPARTMENT_OTHER): Payer: Self-pay

## 2022-01-04 ENCOUNTER — Other Ambulatory Visit (HOSPITAL_BASED_OUTPATIENT_CLINIC_OR_DEPARTMENT_OTHER): Payer: Self-pay

## 2022-01-23 ENCOUNTER — Other Ambulatory Visit (HOSPITAL_BASED_OUTPATIENT_CLINIC_OR_DEPARTMENT_OTHER): Payer: Self-pay

## 2022-01-28 ENCOUNTER — Other Ambulatory Visit (HOSPITAL_BASED_OUTPATIENT_CLINIC_OR_DEPARTMENT_OTHER): Payer: Self-pay

## 2022-01-29 ENCOUNTER — Other Ambulatory Visit (HOSPITAL_BASED_OUTPATIENT_CLINIC_OR_DEPARTMENT_OTHER): Payer: Self-pay

## 2022-02-18 ENCOUNTER — Other Ambulatory Visit (HOSPITAL_BASED_OUTPATIENT_CLINIC_OR_DEPARTMENT_OTHER): Payer: Self-pay

## 2022-02-19 ENCOUNTER — Other Ambulatory Visit (HOSPITAL_BASED_OUTPATIENT_CLINIC_OR_DEPARTMENT_OTHER): Payer: Self-pay

## 2022-02-19 ENCOUNTER — Other Ambulatory Visit: Payer: Self-pay

## 2022-02-20 ENCOUNTER — Other Ambulatory Visit (HOSPITAL_BASED_OUTPATIENT_CLINIC_OR_DEPARTMENT_OTHER): Payer: Self-pay

## 2022-02-26 ENCOUNTER — Other Ambulatory Visit (HOSPITAL_BASED_OUTPATIENT_CLINIC_OR_DEPARTMENT_OTHER): Payer: Self-pay

## 2022-03-20 ENCOUNTER — Other Ambulatory Visit (HOSPITAL_BASED_OUTPATIENT_CLINIC_OR_DEPARTMENT_OTHER): Payer: Self-pay

## 2022-03-30 ENCOUNTER — Other Ambulatory Visit: Payer: Self-pay

## 2022-04-01 ENCOUNTER — Other Ambulatory Visit: Payer: Self-pay

## 2022-04-03 ENCOUNTER — Other Ambulatory Visit (HOSPITAL_BASED_OUTPATIENT_CLINIC_OR_DEPARTMENT_OTHER): Payer: Self-pay

## 2022-04-04 ENCOUNTER — Other Ambulatory Visit (HOSPITAL_BASED_OUTPATIENT_CLINIC_OR_DEPARTMENT_OTHER): Payer: Self-pay

## 2022-04-10 ENCOUNTER — Other Ambulatory Visit (HOSPITAL_BASED_OUTPATIENT_CLINIC_OR_DEPARTMENT_OTHER): Payer: Self-pay

## 2022-04-10 MED ORDER — OMRON 3 SERIES BP MONITOR DEVI
0 refills | Status: AC
  Filled 2022-04-10: qty 1, 30d supply, fill #0

## 2022-04-11 ENCOUNTER — Other Ambulatory Visit (HOSPITAL_BASED_OUTPATIENT_CLINIC_OR_DEPARTMENT_OTHER): Payer: Self-pay

## 2022-04-30 ENCOUNTER — Other Ambulatory Visit (HOSPITAL_BASED_OUTPATIENT_CLINIC_OR_DEPARTMENT_OTHER): Payer: Self-pay

## 2022-04-30 MED ORDER — WEGOVY 1.7 MG/0.75ML ~~LOC~~ SOAJ
0.7500 mL | SUBCUTANEOUS | 2 refills | Status: DC
  Filled 2022-04-30: qty 9, 84d supply, fill #0

## 2022-05-02 ENCOUNTER — Other Ambulatory Visit (HOSPITAL_BASED_OUTPATIENT_CLINIC_OR_DEPARTMENT_OTHER): Payer: Self-pay

## 2022-05-03 ENCOUNTER — Other Ambulatory Visit (HOSPITAL_BASED_OUTPATIENT_CLINIC_OR_DEPARTMENT_OTHER): Payer: Self-pay

## 2022-05-05 ENCOUNTER — Other Ambulatory Visit (HOSPITAL_BASED_OUTPATIENT_CLINIC_OR_DEPARTMENT_OTHER): Payer: Self-pay

## 2022-05-07 ENCOUNTER — Other Ambulatory Visit (HOSPITAL_BASED_OUTPATIENT_CLINIC_OR_DEPARTMENT_OTHER): Payer: Self-pay

## 2022-05-08 ENCOUNTER — Other Ambulatory Visit (HOSPITAL_BASED_OUTPATIENT_CLINIC_OR_DEPARTMENT_OTHER): Payer: Self-pay

## 2022-05-10 ENCOUNTER — Other Ambulatory Visit (HOSPITAL_BASED_OUTPATIENT_CLINIC_OR_DEPARTMENT_OTHER): Payer: Self-pay

## 2022-05-17 ENCOUNTER — Other Ambulatory Visit: Payer: Self-pay

## 2022-05-28 ENCOUNTER — Other Ambulatory Visit (HOSPITAL_BASED_OUTPATIENT_CLINIC_OR_DEPARTMENT_OTHER): Payer: Self-pay

## 2022-07-02 ENCOUNTER — Other Ambulatory Visit: Payer: Self-pay

## 2022-07-16 ENCOUNTER — Other Ambulatory Visit (HOSPITAL_BASED_OUTPATIENT_CLINIC_OR_DEPARTMENT_OTHER): Payer: Self-pay

## 2022-07-17 ENCOUNTER — Other Ambulatory Visit (HOSPITAL_BASED_OUTPATIENT_CLINIC_OR_DEPARTMENT_OTHER): Payer: Self-pay

## 2022-07-17 MED ORDER — WEGOVY 1.7 MG/0.75ML ~~LOC~~ SOAJ
1.7000 mg | SUBCUTANEOUS | 2 refills | Status: AC
  Filled 2022-07-17 – 2023-05-05 (×4): qty 3, 28d supply, fill #0

## 2022-07-18 ENCOUNTER — Other Ambulatory Visit (HOSPITAL_BASED_OUTPATIENT_CLINIC_OR_DEPARTMENT_OTHER): Payer: Self-pay

## 2022-07-19 ENCOUNTER — Other Ambulatory Visit (HOSPITAL_BASED_OUTPATIENT_CLINIC_OR_DEPARTMENT_OTHER): Payer: Self-pay

## 2022-07-23 ENCOUNTER — Other Ambulatory Visit (HOSPITAL_COMMUNITY): Payer: Self-pay

## 2022-07-26 DIAGNOSIS — R7303 Prediabetes: Secondary | ICD-10-CM | POA: Diagnosis not present

## 2022-07-26 DIAGNOSIS — N529 Male erectile dysfunction, unspecified: Secondary | ICD-10-CM | POA: Diagnosis not present

## 2022-07-26 DIAGNOSIS — D509 Iron deficiency anemia, unspecified: Secondary | ICD-10-CM | POA: Diagnosis not present

## 2022-07-26 DIAGNOSIS — Z0000 Encounter for general adult medical examination without abnormal findings: Secondary | ICD-10-CM | POA: Diagnosis not present

## 2022-07-26 DIAGNOSIS — Z79899 Other long term (current) drug therapy: Secondary | ICD-10-CM | POA: Diagnosis not present

## 2022-07-26 DIAGNOSIS — Z125 Encounter for screening for malignant neoplasm of prostate: Secondary | ICD-10-CM | POA: Diagnosis not present

## 2022-07-26 DIAGNOSIS — E559 Vitamin D deficiency, unspecified: Secondary | ICD-10-CM | POA: Diagnosis not present

## 2022-07-26 DIAGNOSIS — I10 Essential (primary) hypertension: Secondary | ICD-10-CM | POA: Diagnosis not present

## 2022-08-16 ENCOUNTER — Other Ambulatory Visit (HOSPITAL_BASED_OUTPATIENT_CLINIC_OR_DEPARTMENT_OTHER): Payer: Self-pay

## 2022-09-23 ENCOUNTER — Other Ambulatory Visit (HOSPITAL_BASED_OUTPATIENT_CLINIC_OR_DEPARTMENT_OTHER): Payer: Self-pay

## 2022-09-23 MED ORDER — OLMESARTAN MEDOXOMIL 20 MG PO TABS
20.0000 mg | ORAL_TABLET | Freq: Every day | ORAL | 3 refills | Status: DC
  Filled 2022-09-23: qty 90, 90d supply, fill #0
  Filled 2022-12-19 (×2): qty 90, 90d supply, fill #1
  Filled 2023-02-25 – 2023-03-21 (×2): qty 90, 90d supply, fill #2

## 2022-09-23 MED ORDER — AMLODIPINE BESYLATE 5 MG PO TABS
5.0000 mg | ORAL_TABLET | Freq: Every day | ORAL | 3 refills | Status: DC
  Filled 2022-09-23: qty 90, 90d supply, fill #0
  Filled 2022-12-19 (×2): qty 90, 90d supply, fill #1
  Filled 2023-02-25 – 2023-03-21 (×2): qty 90, 90d supply, fill #2

## 2022-11-12 DIAGNOSIS — H5213 Myopia, bilateral: Secondary | ICD-10-CM | POA: Diagnosis not present

## 2022-12-06 ENCOUNTER — Other Ambulatory Visit (HOSPITAL_BASED_OUTPATIENT_CLINIC_OR_DEPARTMENT_OTHER): Payer: Self-pay

## 2022-12-06 MED ORDER — INFLUENZA VIRUS VACC SPLIT PF (FLUZONE) 0.5 ML IM SUSY
0.5000 mL | PREFILLED_SYRINGE | Freq: Once | INTRAMUSCULAR | 0 refills | Status: AC
  Filled 2022-12-06: qty 0.5, 1d supply, fill #0

## 2022-12-19 ENCOUNTER — Other Ambulatory Visit (HOSPITAL_BASED_OUTPATIENT_CLINIC_OR_DEPARTMENT_OTHER): Payer: Self-pay

## 2023-01-22 ENCOUNTER — Other Ambulatory Visit (HOSPITAL_BASED_OUTPATIENT_CLINIC_OR_DEPARTMENT_OTHER): Payer: Self-pay

## 2023-02-05 ENCOUNTER — Telehealth: Payer: 59 | Admitting: Family Medicine

## 2023-02-05 ENCOUNTER — Other Ambulatory Visit (HOSPITAL_BASED_OUTPATIENT_CLINIC_OR_DEPARTMENT_OTHER): Payer: Self-pay

## 2023-02-05 DIAGNOSIS — B9689 Other specified bacterial agents as the cause of diseases classified elsewhere: Secondary | ICD-10-CM

## 2023-02-05 DIAGNOSIS — J208 Acute bronchitis due to other specified organisms: Secondary | ICD-10-CM

## 2023-02-05 MED ORDER — ALBUTEROL SULFATE HFA 108 (90 BASE) MCG/ACT IN AERS
1.0000 | INHALATION_SPRAY | Freq: Four times a day (QID) | RESPIRATORY_TRACT | 0 refills | Status: AC | PRN
Start: 2023-02-05 — End: ?
  Filled 2023-02-05: qty 6.7, 25d supply, fill #0

## 2023-02-05 MED ORDER — AZITHROMYCIN 250 MG PO TABS
ORAL_TABLET | ORAL | 0 refills | Status: AC
Start: 2023-02-05 — End: 2023-02-10
  Filled 2023-02-05: qty 6, 5d supply, fill #0

## 2023-02-05 MED ORDER — PREDNISONE 20 MG PO TABS
40.0000 mg | ORAL_TABLET | Freq: Every day | ORAL | 0 refills | Status: AC
Start: 2023-02-05 — End: 2023-02-10
  Filled 2023-02-05: qty 10, 5d supply, fill #0

## 2023-02-05 MED ORDER — PROMETHAZINE-DM 6.25-15 MG/5ML PO SYRP
5.0000 mL | ORAL_SOLUTION | Freq: Four times a day (QID) | ORAL | 0 refills | Status: DC | PRN
Start: 2023-02-05 — End: 2024-01-22
  Filled 2023-02-05: qty 118, 6d supply, fill #0

## 2023-02-05 NOTE — Progress Notes (Signed)
Virtual Visit Consent   Craig Thompson, you are scheduled for a virtual visit with a [facility] provider today. Just as with appointments in the office, your consent must be obtained to participate. Your consent will be active for this visit and any virtual visit you may have with one of our providers in the next 365 days. If you have a MyChart account, a copy of this consent can be sent to you electronically.  As this is a virtual visit, video technology does not allow for your provider to perform a traditional examination. This may limit your provider's ability to fully assess your condition. If your provider identifies any concerns that need to be evaluated in person or the need to arrange testing (such as labs, EKG, etc.), we will make arrangements to do so. Although advances in technology are sophisticated, we cannot ensure that it will always work on either your end or our end. If the connection with a video visit is poor, the visit may have to be switched to a telephone visit. With either a video or telephone visit, we are not always able to ensure that we have a secure connection.  By engaging in this virtual visit, you consent to the provision of healthcare and authorize for your insurance to be billed (if applicable) for the services provided during this visit. Depending on your insurance coverage, you may receive a charge related to this service.  I need to obtain your verbal consent now. Are you willing to proceed with your visit today? Craig Thompson has provided verbal consent on 02/05/2023 for a virtual visit (video or telephone). [name], NP  Date: 02/05/2023 9:15 AM  Virtual Visit via Video Note   I, [name], connected with  Craig Thompson  ([id number], 1965/09/23) on 02/05/23 at  9:15 AM EST by a video-enabled telemedicine application and verified that I am speaking with the correct person using two identifiers.  Location: Patient: Virtual Visit Location Patient: Home Provider:  Virtual Visit Location Provider: Home Office   I discussed the limitations of evaluation and management by telemedicine and the availability of in person appointments. The patient expressed understanding and agreed to proceed.    History of Present Illness: Craig Thompson is a 57 y.o. who identifies as a male who was assigned adult at birth, and is being seen today for cough and voice lost  Onset was a week ago with cough and congestion, and some vertigo Associated symptoms are worsen when laying down, has PND and it worsens at night, congestion is worse then and cough is much worse when trying to sleep, coughing is making a headache occur, cough is at times productive- was dry for the first part of week, Sputum is white. Modifying factors are flonase, motrin, nyquil  Denies chest pain, shortness of breath, fevers, chills  Exposure to sick contacts- unknown, but works with public COVID test: neg Vaccines: Flu vaccine yes, no Covid booster    Problems:  Patient Active Problem List   Diagnosis Date Noted   Right cervical radiculopathy at C6 09/04/2011    Allergies: No Known Allergies Medications:  Current Outpatient Medications:    amLODipine (NORVASC) 10 MG tablet, Take 10 mg by mouth daily., Disp: , Rfl:    amLODipine (NORVASC) 5 MG tablet, TAKE 1 TABLET BY MOUTH ONCE DAILY, Disp: 90 tablet, Rfl: 3   amLODipine (NORVASC) 5 MG tablet, TAKE 1 TABLET BY MOUTH ONCE DAILY, Disp: 90 tablet, Rfl: 3  amLODipine (NORVASC) 5 MG tablet, Take 1 tablet (5 mg total) by mouth daily., Disp: 90 tablet, Rfl: 3   Blood Pressure Monitoring (OMRON 3 SERIES BP MONITOR) DEVI, Use as directed, Disp: 1 each, Rfl: 0   COVID-19 At Home Antigen Test (CARESTART COVID-19 HOME TEST) KIT, Use as directed per package instructions, Disp: 8 each, Rfl: 0   hydrocortisone (ANUSOL-HC) 25 MG suppository, Insert 1 suppository rectally Twice a day 14 days, Disp: 28 suppository, Rfl: 2   meloxicam (MOBIC) 15 MG tablet, TAKE 1  TABLET IN THE MORNING WITH BREAKFAST X 2 WEEKS, THEN 1 TABLET ONCE DAILY AS NEEDED FOR PAIN, Disp: 30 tablet, Rfl: 3   olmesartan (BENICAR) 20 MG tablet, TAKE 1 TABLET ONCE A DAY ORALLY 90 DAYS, Disp: , Rfl: 3   olmesartan (BENICAR) 20 MG tablet, TAKE 1 TABLET BY MOUTH ONCE DAILY, Disp: 90 tablet, Rfl: 3   olmesartan (BENICAR) 20 MG tablet, TAKE 1 TABLET BY MOUTH ONCE DAILY, Disp: 90 tablet, Rfl: 3   olmesartan (BENICAR) 20 MG tablet, Take 1 tablet (20 mg total) by mouth daily., Disp: 90 tablet, Rfl: 3   Semaglutide-Weight Management (WEGOVY) 0.25 MG/0.5ML SOAJ, Inject 0.3mls under the skin once weekly, Disp: 2 mL, Rfl: 0   Semaglutide-Weight Management (WEGOVY) 0.5 MG/0.5ML SOAJ, Inject 0.5 ml under the skin once a week, Disp: 2 mL, Rfl: 0   Semaglutide-Weight Management (WEGOVY) 1 MG/0.5ML SOAJ, Inject 0.48ml under the skin once weekly, Disp: 4 mL, Rfl: 2   Semaglutide-Weight Management (WEGOVY) 1 MG/0.5ML SOAJ, Inject 1 mg into the skin once a week., Disp: 6 mL, Rfl: 0   Semaglutide-Weight Management (WEGOVY) 1.7 MG/0.75ML SOAJ, Inject 1.7 mg into the skin once a week., Disp: 3 mL, Rfl: 2  Observations/Objective: Patient is well-developed, well-nourished in no acute distress.  Resting comfortably  at home.  Head is normocephalic, atraumatic.  No labored breathing.  Speech is clear and coherent with logical content.  Patient is alert and oriented at baseline.  Hoarseness noted Dry cough present   Assessment and Plan:  1. Acute bacterial bronchitis - predniSONE (DELTASONE) 20 MG tablet; Take 2 tablets (40 mg total) by mouth daily with breakfast for 5 days.  Dispense: 10 tablet; Refill: 0 - promethazine-dextromethorphan (PROMETHAZINE-DM) 6.25-15 MG/5ML syrup; Take 5 mLs by mouth 4 (four) times daily as needed for cough.  Dispense: 118 mL; Refill: 0 - albuterol (VENTOLIN HFA) 108 (90 Base) MCG/ACT inhaler; Inhale 1-2 puffs into the lungs every 6 (six) hours as needed for wheezing or  shortness of breath (cough).  Dispense: 6.7 g; Refill: 0 - azithromycin (ZITHROMAX) 250 MG tablet; Take 2 tablets on day 1, then 1 tablet daily on days 2 through 5  Dispense: 6 tablet; Refill: 0   - Take meds as prescribed- did order Zpk due to duration and starting of mucus production. - Rest voice - Use a cool mist humidifier especially during the winter months when heat dries out the air. - Use saline nose sprays frequently to help soothe nasal passages if they are drying out. - Stay hydrated by drinking plenty of fluids - Keep thermostat turn down low to prevent drying out which can cause a dry cough. - For any cough or congestion- robitussin DM or Delsym as needed - For fever or aches or pains- take tylenol or ibuprofen as directed on bottle             * for fevers greater than 101 orally you may alternate ibuprofen and  tylenol every 3 hours.  If you do not improve you will need a follow up visit in person.                 Reviewed side effects, risks and benefits of medication.    Patient acknowledged agreement and understanding of the plan.   Past Medical, Surgical, Social History, Allergies, and Medications have been Reviewed.      Follow Up Instructions: I discussed the assessment and treatment plan with the patient. The patient was provided an opportunity to ask questions and all were answered. The patient agreed with the plan and demonstrated an understanding of the instructions.  A copy of instructions were sent to the patient via MyChart unless otherwise noted below.    The patient was advised to call back or seek an in-person evaluation if the symptoms worsen or if the condition fails to improve as anticipated.    [name], NP

## 2023-02-05 NOTE — Patient Instructions (Signed)
Craig Thompson, thank you for joining Craig Finner, NP for today's virtual visit.  While this provider is not your primary care provider (PCP), if your PCP is located in our provider database this encounter information will be shared with them immediately following your visit.   A [facility] MyChart account gives you access to today's visit and all your visits, tests, and labs performed at [facility] " click here if you don't have a [facility] MyChart account or go to [email or web address]  Consent: (Patient) Craig Thompson provided verbal consent for this virtual visit at the beginning of the encounter.  Current Medications:  Current Outpatient Medications:    albuterol (VENTOLIN HFA) 108 (90 Base) MCG/ACT inhaler, Inhale 1-2 puffs into the lungs every 6 (six) hours as needed for wheezing or shortness of breath (cough)., Disp: 6.7 g, Rfl: 0   azithromycin (ZITHROMAX) 250 MG tablet, Take 2 tablets on day 1, then 1 tablet daily on days 2 through 5, Disp: 6 tablet, Rfl: 0   predniSONE (DELTASONE) 20 MG tablet, Take 2 tablets (40 mg total) by mouth daily with breakfast for 5 days., Disp: 10 tablet, Rfl: 0   promethazine-dextromethorphan (PROMETHAZINE-DM) 6.25-15 MG/5ML syrup, Take 5 mLs by mouth 4 (four) times daily as needed for cough., Disp: 118 mL, Rfl: 0   amLODipine (NORVASC) 10 MG tablet, Take 10 mg by mouth daily., Disp: , Rfl:    amLODipine (NORVASC) 5 MG tablet, TAKE 1 TABLET BY MOUTH ONCE DAILY, Disp: 90 tablet, Rfl: 3   amLODipine (NORVASC) 5 MG tablet, TAKE 1 TABLET BY MOUTH ONCE DAILY, Disp: 90 tablet, Rfl: 3   amLODipine (NORVASC) 5 MG tablet, Take 1 tablet (5 mg total) by mouth daily., Disp: 90 tablet, Rfl: 3   Blood Pressure Monitoring (OMRON 3 SERIES BP MONITOR) DEVI, Use as directed, Disp: 1 each, Rfl: 0   COVID-19 At Home Antigen Test (CARESTART COVID-19 HOME TEST) KIT, Use as directed per package instructions, Disp: 8 each, Rfl: 0   hydrocortisone (ANUSOL-HC) 25 MG  suppository, Insert 1 suppository rectally Twice a day 14 days, Disp: 28 suppository, Rfl: 2   meloxicam (MOBIC) 15 MG tablet, TAKE 1 TABLET IN THE MORNING WITH BREAKFAST X 2 WEEKS, THEN 1 TABLET ONCE DAILY AS NEEDED FOR PAIN, Disp: 30 tablet, Rfl: 3   olmesartan (BENICAR) 20 MG tablet, TAKE 1 TABLET ONCE A DAY ORALLY 90 DAYS, Disp: , Rfl: 3   olmesartan (BENICAR) 20 MG tablet, TAKE 1 TABLET BY MOUTH ONCE DAILY, Disp: 90 tablet, Rfl: 3   olmesartan (BENICAR) 20 MG tablet, TAKE 1 TABLET BY MOUTH ONCE DAILY, Disp: 90 tablet, Rfl: 3   olmesartan (BENICAR) 20 MG tablet, Take 1 tablet (20 mg total) by mouth daily., Disp: 90 tablet, Rfl: 3   Semaglutide-Weight Management (WEGOVY) 0.25 MG/0.5ML SOAJ, Inject 0.66mls under the skin once weekly, Disp: 2 mL, Rfl: 0   Semaglutide-Weight Management (WEGOVY) 0.5 MG/0.5ML SOAJ, Inject 0.5 ml under the skin once a week, Disp: 2 mL, Rfl: 0   Semaglutide-Weight Management (WEGOVY) 1 MG/0.5ML SOAJ, Inject 0.62ml under the skin once weekly, Disp: 4 mL, Rfl: 2   Semaglutide-Weight Management (WEGOVY) 1 MG/0.5ML SOAJ, Inject 1 mg into the skin once a week., Disp: 6 mL, Rfl: 0   Semaglutide-Weight Management (WEGOVY) 1.7 MG/0.75ML SOAJ, Inject 1.7 mg into the skin once a week., Disp: 3 mL, Rfl: 2   Medications ordered in this encounter:  Meds ordered this encounter  Medications   predniSONE (  DELTASONE) 20 MG tablet    Sig: Take 2 tablets (40 mg total) by mouth daily with breakfast for 5 days.    Dispense:  10 tablet    Refill:  0    Order Specific Question:   Supervising Provider    Answer:   Merrilee Jansky [id number]   promethazine-dextromethorphan (PROMETHAZINE-DM) 6.25-15 MG/5ML syrup    Sig: Take 5 mLs by mouth 4 (four) times daily as needed for cough.    Dispense:  118 mL    Refill:  0    Order Specific Question:   Supervising Provider    Answer:   Merrilee Jansky [[id number]]   albuterol (VENTOLIN HFA) 108 (90 Base) MCG/ACT inhaler    Sig: Inhale 1-2 puffs  into the lungs every 6 (six) hours as needed for wheezing or shortness of breath (cough).    Dispense:  6.7 g    Refill:  0    Order Specific Question:   Supervising Provider    Answer:   Merrilee Jansky [[id number]]   azithromycin (ZITHROMAX) 250 MG tablet    Sig: Take 2 tablets on day 1, then 1 tablet daily on days 2 through 5    Dispense:  6 tablet    Refill:  0    Order Specific Question:   Supervising Provider    Answer:   Merrilee Jansky [[id number]]     *If you need refills on other medications prior to your next appointment, please contact your pharmacy*  Follow-Up: Call back or seek an in-person evaluation if the symptoms worsen or if the condition fails to improve as anticipated.  [facility] [phone]  Other Instructions  - Take meds as prescribed - Rest voice - Use a cool mist humidifier especially during the winter months when heat dries out the air. - Use saline nose sprays frequently to help soothe nasal passages if they are drying out. - Stay hydrated by drinking plenty of fluids - Keep thermostat turn down low to prevent drying out which can cause a dry cough.  - For fever or aches or pains- take tylenol or ibuprofen as directed on bottle             * for fevers greater than 101 orally you may alternate ibuprofen and tylenol every 3 hours.  If you do not improve you will need a follow up visit in person.                   If you have been instructed to have an in-person evaluation today at a local Urgent Care facility, please use the link below. It will take you to a list of all of our available [facility], including address, phone number and hours of operation. Please do not delay care.  [facility]  If you or a family member do not have a primary care provider, use the link below to schedule a visit and establish care. When you choose a [facility] primary care physician or advanced practice provider, you gain a  long-term partner in health. Find a Primary Care Provider  Learn more about [facility]'s in-office and virtual care options: [facility] - Get Care Now

## 2023-02-12 ENCOUNTER — Other Ambulatory Visit (HOSPITAL_BASED_OUTPATIENT_CLINIC_OR_DEPARTMENT_OTHER): Payer: Self-pay

## 2023-02-24 ENCOUNTER — Other Ambulatory Visit (HOSPITAL_BASED_OUTPATIENT_CLINIC_OR_DEPARTMENT_OTHER): Payer: Self-pay

## 2023-02-24 ENCOUNTER — Other Ambulatory Visit: Payer: Self-pay | Admitting: Family Medicine

## 2023-02-24 DIAGNOSIS — B9689 Other specified bacterial agents as the cause of diseases classified elsewhere: Secondary | ICD-10-CM

## 2023-02-25 ENCOUNTER — Other Ambulatory Visit: Payer: Self-pay

## 2023-03-03 ENCOUNTER — Other Ambulatory Visit (HOSPITAL_BASED_OUTPATIENT_CLINIC_OR_DEPARTMENT_OTHER): Payer: Self-pay

## 2023-03-13 ENCOUNTER — Other Ambulatory Visit (HOSPITAL_BASED_OUTPATIENT_CLINIC_OR_DEPARTMENT_OTHER): Payer: Self-pay

## 2023-03-18 ENCOUNTER — Other Ambulatory Visit (HOSPITAL_BASED_OUTPATIENT_CLINIC_OR_DEPARTMENT_OTHER): Payer: Self-pay

## 2023-03-19 ENCOUNTER — Other Ambulatory Visit (HOSPITAL_BASED_OUTPATIENT_CLINIC_OR_DEPARTMENT_OTHER): Payer: Self-pay

## 2023-03-19 ENCOUNTER — Other Ambulatory Visit: Payer: Self-pay

## 2023-03-19 MED ORDER — OLMESARTAN MEDOXOMIL 40 MG PO TABS
40.0000 mg | ORAL_TABLET | Freq: Every day | ORAL | 1 refills | Status: DC
  Filled 2023-03-19: qty 90, 90d supply, fill #0
  Filled 2023-06-09: qty 90, 90d supply, fill #1

## 2023-03-19 MED ORDER — AMLODIPINE BESYLATE 10 MG PO TABS
10.0000 mg | ORAL_TABLET | Freq: Every day | ORAL | 1 refills | Status: DC
  Filled 2023-03-19: qty 90, 90d supply, fill #0
  Filled 2023-06-09: qty 90, 90d supply, fill #1

## 2023-03-21 ENCOUNTER — Other Ambulatory Visit (HOSPITAL_BASED_OUTPATIENT_CLINIC_OR_DEPARTMENT_OTHER): Payer: Self-pay

## 2023-05-06 ENCOUNTER — Other Ambulatory Visit (HOSPITAL_BASED_OUTPATIENT_CLINIC_OR_DEPARTMENT_OTHER): Payer: Self-pay

## 2023-05-08 ENCOUNTER — Other Ambulatory Visit (HOSPITAL_BASED_OUTPATIENT_CLINIC_OR_DEPARTMENT_OTHER): Payer: Self-pay

## 2023-07-22 ENCOUNTER — Other Ambulatory Visit (HOSPITAL_COMMUNITY): Payer: Self-pay

## 2023-07-29 ENCOUNTER — Other Ambulatory Visit (HOSPITAL_BASED_OUTPATIENT_CLINIC_OR_DEPARTMENT_OTHER): Payer: Self-pay

## 2023-07-29 MED ORDER — CIPROFLOXACIN HCL 500 MG PO TABS
500.0000 mg | ORAL_TABLET | Freq: Two times a day (BID) | ORAL | 0 refills | Status: DC
  Filled 2023-07-29: qty 20, 10d supply, fill #0

## 2023-08-20 DIAGNOSIS — I10 Essential (primary) hypertension: Secondary | ICD-10-CM | POA: Diagnosis not present

## 2023-08-20 DIAGNOSIS — E559 Vitamin D deficiency, unspecified: Secondary | ICD-10-CM | POA: Diagnosis not present

## 2023-08-20 DIAGNOSIS — Z79899 Other long term (current) drug therapy: Secondary | ICD-10-CM | POA: Diagnosis not present

## 2023-08-20 DIAGNOSIS — N529 Male erectile dysfunction, unspecified: Secondary | ICD-10-CM | POA: Diagnosis not present

## 2023-08-20 DIAGNOSIS — Z0000 Encounter for general adult medical examination without abnormal findings: Secondary | ICD-10-CM | POA: Diagnosis not present

## 2023-08-20 DIAGNOSIS — D509 Iron deficiency anemia, unspecified: Secondary | ICD-10-CM | POA: Diagnosis not present

## 2023-08-26 DIAGNOSIS — N529 Male erectile dysfunction, unspecified: Secondary | ICD-10-CM | POA: Diagnosis not present

## 2023-08-28 ENCOUNTER — Other Ambulatory Visit (HOSPITAL_BASED_OUTPATIENT_CLINIC_OR_DEPARTMENT_OTHER): Payer: Self-pay

## 2023-08-28 MED ORDER — OLMESARTAN MEDOXOMIL 40 MG PO TABS
40.0000 mg | ORAL_TABLET | Freq: Every day | ORAL | 1 refills | Status: AC
  Filled 2023-08-28: qty 90, 90d supply, fill #0
  Filled 2023-12-08: qty 90, 90d supply, fill #1

## 2023-08-28 MED ORDER — AMLODIPINE BESYLATE 10 MG PO TABS
10.0000 mg | ORAL_TABLET | Freq: Every day | ORAL | 1 refills | Status: AC
  Filled 2023-08-28: qty 90, 90d supply, fill #0
  Filled 2023-12-08: qty 90, 90d supply, fill #1

## 2023-12-19 ENCOUNTER — Other Ambulatory Visit (HOSPITAL_BASED_OUTPATIENT_CLINIC_OR_DEPARTMENT_OTHER): Payer: Self-pay

## 2023-12-19 MED ORDER — FLUZONE 0.5 ML IM SUSY
0.5000 mL | PREFILLED_SYRINGE | Freq: Once | INTRAMUSCULAR | 0 refills | Status: AC
  Filled 2023-12-19: qty 0.5, 1d supply, fill #0

## 2024-01-22 ENCOUNTER — Encounter (HOSPITAL_BASED_OUTPATIENT_CLINIC_OR_DEPARTMENT_OTHER): Payer: Self-pay | Admitting: Family Medicine

## 2024-01-22 ENCOUNTER — Ambulatory Visit (INDEPENDENT_AMBULATORY_CARE_PROVIDER_SITE_OTHER): Admitting: Family Medicine

## 2024-01-22 VITALS — BP 138/76 | HR 74 | Ht 69.0 in | Wt 201.0 lb

## 2024-01-22 DIAGNOSIS — R7989 Other specified abnormal findings of blood chemistry: Secondary | ICD-10-CM | POA: Diagnosis not present

## 2024-01-22 DIAGNOSIS — I10 Essential (primary) hypertension: Secondary | ICD-10-CM | POA: Insufficient documentation

## 2024-01-22 DIAGNOSIS — Z125 Encounter for screening for malignant neoplasm of prostate: Secondary | ICD-10-CM

## 2024-01-22 DIAGNOSIS — Z0000 Encounter for general adult medical examination without abnormal findings: Secondary | ICD-10-CM

## 2024-01-22 NOTE — Assessment & Plan Note (Signed)
 Blood pressure borderline in office today.  He does check blood pressure at home and blood pressure at home has been similar to ours in the office. Can continue with current medication regimen.  Recommend intermittent monitoring blood pressure at home, DASH diet

## 2024-01-22 NOTE — Progress Notes (Signed)
 New Patient Office Visit  Subjective   Patient ID: Craig Thompson, male    DOB: 1966-02-08  Age: 58 y.o. MRN: [id number]  CC:  Chief Complaint  Patient presents with   Establish Care    Taking amlodipine  in morning and olmesartan  at night    HPI Craig Thompson presents to establish care Last PCP - Reena Duck  Hypertension: Currently managing with amlodipine  and irbesartan.  Denies any issues with medication, has not had any recent changes with medications.  He does check blood pressure at home and generally these have been well-controlled.  Reports that testosterone  was being monitored with his last PCP and indicates borderline low readings at times.  Notes history of cervical radiculopathy.  Was treated in the past with steroid injection, did well with this.  Has noted some very mild symptoms beginning recently.  Symptoms are not currently affecting enough that he would want to do much about it at present.  Patient is originally from [place] , has lived here since 2006.  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  Notes that his children all completed as D1 athletes and various track and field events.  He does like to travel.  He used to golf more frequently, less so now.  Does walk regularly.  Recently bought a rower and waiting to receive this.  Outpatient Encounter Medications as of 01/22/2024  Medication Sig   amLODipine  (NORVASC ) 10 MG tablet Take 1 tablet (10 mg total) by mouth daily.   Blood Pressure Monitoring (OMRON 3 SERIES BP MONITOR) DEVI Use as directed   fluticasone (FLONASE) 50 MCG/ACT nasal spray Place 2 sprays into both nostrils daily.   hydrocortisone  (ANUSOL -HC) 25 MG suppository Insert 1 suppository rectally Twice a day 14 days   olmesartan  (BENICAR ) 40 MG tablet Take 1 tablet (40 mg total) by mouth daily.   Semaglutide -Weight Management (WEGOVY ) 1.7 MG/0.75ML SOAJ Inject 1.7 mg into the skin once a week.   tadalafil  (CIALIS ) 5 MG tablet Take  5 mg by mouth daily as needed.   albuterol  (VENTOLIN  HFA) 108 (90 Base) MCG/ACT inhaler Inhale 1-2 puffs into the lungs every 6 (six) hours as needed for wheezing or shortness of breath (cough). (Patient not taking: Reported on 01/22/2024)   COVID-19 At Home Antigen Test (CARESTART COVID-19 HOME TEST) KIT Use as directed per package instructions   [DISCONTINUED] amLODipine  (NORVASC ) 10 MG tablet Take 10 mg by mouth daily.   [DISCONTINUED] amLODipine  (NORVASC ) 5 MG tablet TAKE 1 TABLET BY MOUTH ONCE DAILY   [DISCONTINUED] amLODipine  (NORVASC ) 5 MG tablet TAKE 1 TABLET BY MOUTH ONCE DAILY   [DISCONTINUED] ciprofloxacin  (CIPRO ) 500 MG tablet Take 1 tablet (500 mg total) by mouth every 12 (twelve) hours for 10 days. (Patient not taking: Reported on 01/22/2024)   [DISCONTINUED] meloxicam  (MOBIC ) 15 MG tablet TAKE 1 TABLET IN THE MORNING WITH BREAKFAST X 2 WEEKS, THEN 1 TABLET ONCE DAILY AS NEEDED FOR PAIN (Patient not taking: Reported on 01/22/2024)   [DISCONTINUED] olmesartan  (BENICAR ) 20 MG tablet TAKE 1 TABLET ONCE A DAY ORALLY 90 DAYS (Patient not taking: Reported on 01/22/2024)   [DISCONTINUED] olmesartan  (BENICAR ) 20 MG tablet TAKE 1 TABLET BY MOUTH ONCE DAILY   [DISCONTINUED] olmesartan  (BENICAR ) 20 MG tablet TAKE 1 TABLET BY MOUTH ONCE DAILY   [DISCONTINUED] promethazine -dextromethorphan (PROMETHAZINE -DM) 6.25-15 MG/5ML syrup Take 5 mLs by mouth 4 (four) times daily as needed for cough.   [DISCONTINUED] Semaglutide -Weight Management (WEGOVY ) 0.25  MG/0.5ML SOAJ Inject 0.5mls under the skin once weekly (Patient not taking: Reported on 01/22/2024)   [DISCONTINUED] Semaglutide -Weight Management (WEGOVY ) 0.5 MG/0.5ML SOAJ Inject 0.5 ml under the skin once a week (Patient not taking: Reported on 01/22/2024)   [DISCONTINUED] Semaglutide -Weight Management (WEGOVY ) 1 MG/0.5ML SOAJ Inject 0.67ml under the skin once weekly (Patient not taking: Reported on 01/22/2024)   [DISCONTINUED] Semaglutide -Weight  Management (WEGOVY ) 1 MG/0.5ML SOAJ Inject 1 mg into the skin once a week. (Patient not taking: Reported on 01/22/2024)   [DISCONTINUED] tadalafil  (CIALIS ) 5 MG tablet Take 1 tablet by mouth once daily as needed   No facility-administered encounter medications on file as of 01/22/2024.    Past Medical History:  Diagnosis Date   Hypertension     Past Surgical History:  Procedure Laterality Date   APPENDECTOMY  1984    Family History  Problem Relation Age of Onset   Diabetes Mother    Hypertension Father    Heart attack Neg Hx    Hyperlipidemia Neg Hx    Sudden death Neg Hx     Social History   Socioeconomic History   Marital status: Married    Spouse name: Not on file   Number of children: Not on file   Years of education: Not on file   Highest education level: Not on file  Occupational History   Not on file  Tobacco Use   Smoking status: Never   Smokeless tobacco: Not on file  Substance and Sexual Activity   Alcohol use: Not on file   Drug use: Not on file   Sexual activity: Not on file  Other Topics Concern   Not on file  Social History Narrative   Not on file   Social Drivers of Health   Financial Resource Strain: Not on file  Food Insecurity: Not on file  Transportation Needs: Not on file  Physical Activity: Not on file  Stress: Not on file  Social Connections: Not on file  Intimate Partner Violence: Not on file    Objective   BP 138/76 (BP Location: Right Arm, Patient Position: Sitting, Cuff Size: Normal)   Pulse 74   Ht 5' 9 (1.753 m)   Wt 201 lb (91.2 kg)   SpO2 100%   BMI 29.68 kg/m   Physical Exam  58 year old male in no acute distress Cardiovascular exam with regular rate and rhythm Lungs clear to auscultation bilaterally.  Assessment & Plan:   Primary hypertension Assessment & Plan: Blood pressure borderline in office today.  He does check blood pressure at home and blood pressure at home has been similar to ours in the  office. Can continue with current medication regimen.  Recommend intermittent monitoring blood pressure at home, DASH diet   Wellness examination -     CBC with Differential/Platelet; Future -     Comprehensive metabolic panel with GFR; Future -     Hemoglobin A1c; Future -     Lipid panel; Future -     TSH Rfx on Abnormal to Free T4; Future  Low testosterone  Assessment & Plan: Patient reports borderline low readings in the past with his prior PCP.  Was monitoring his with intermittent labs.  Would like to have this checked with labs for next physical, lab ordered today.  Orders: -     Testosterone ; Future  Prostate cancer screening -     PSA Total (Reflex To Free); Future  Return in about 6 months (around 07/13/2024) for CPE with fasting labs 1  week prior.    ___________________________________________ Sheryl [place], MD, ABFM, CAQSM [facility]

## 2024-01-22 NOTE — Assessment & Plan Note (Signed)
 Patient reports borderline low readings in the past with his prior PCP.  Was monitoring his with intermittent labs.  Would like to have this checked with labs for next physical, lab ordered today.

## 2024-03-03 ENCOUNTER — Other Ambulatory Visit (HOSPITAL_BASED_OUTPATIENT_CLINIC_OR_DEPARTMENT_OTHER): Payer: Self-pay

## 2024-06-29 ENCOUNTER — Encounter (HOSPITAL_BASED_OUTPATIENT_CLINIC_OR_DEPARTMENT_OTHER): Admitting: Family Medicine
